# Patient Record
Sex: Male | Born: 1995 | Race: Black or African American | Hispanic: No | Marital: Single | Smoking: Current every day smoker
Health system: Southern US, Community
[De-identification: ages and names within clinical notes are randomized; demographics above are authoritative.]

## PROBLEM LIST (undated history)

## (undated) DIAGNOSIS — J45909 Unspecified asthma, uncomplicated: Secondary | ICD-10-CM

---
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
## 2016-07-19 ENCOUNTER — Encounter (HOSPITAL_COMMUNITY): Payer: Self-pay | Admitting: Emergency Medicine

## 2016-07-19 DIAGNOSIS — R5381 Other malaise: Secondary | ICD-10-CM | POA: Insufficient documentation

## 2016-07-19 DIAGNOSIS — F17200 Nicotine dependence, unspecified, uncomplicated: Secondary | ICD-10-CM | POA: Insufficient documentation

## 2016-07-19 DIAGNOSIS — J45909 Unspecified asthma, uncomplicated: Secondary | ICD-10-CM | POA: Insufficient documentation

## 2016-07-19 NOTE — ED Triage Notes (Signed)
Patient reports low grade fever this morning , denies cough or congestion / respirations unlabored , afebrile at triage .

## 2016-07-20 ENCOUNTER — Emergency Department (HOSPITAL_COMMUNITY)
Admission: EM | Admit: 2016-07-20 | Discharge: 2016-07-20 | Disposition: A | Discharge status: Home / Self Care | Payer: Self-pay | Attending: Emergency Medicine | Admitting: Emergency Medicine

## 2016-07-20 DIAGNOSIS — R5381 Other malaise: Secondary | ICD-10-CM

## 2016-07-20 HISTORY — DX: Unspecified asthma, uncomplicated: J45.909

## 2016-07-20 NOTE — ED Notes (Signed)
Pt verbalized understanding of d/c instructions and has no further questions. Pt stable and NAD. VSS. Pt given work note.

## 2016-07-20 NOTE — ED Provider Notes (Signed)
[facility]-EMERGENCY DEPT Provider Note   CSN: [id number] Arrival date & time: 07/19/16  2101     History   Chief Complaint Chief Complaint  Patient presents with  . Fever    HPI Dan Johnson is a 21 y.o. male.  Patient with history of asthma presents with one day history of low grade fever, fatigue, general malaise. When he woke this morning he felt symptomatic and reports through the day his symptoms have resolved. No cough, vomiting, diarrhea, pain. He states he came in because he needs a note to return to work tomorrow.    The history is provided by the patient. No language interpreter was used.    Past Medical History:  Diagnosis Date  . Asthma     There are no active problems to display for this patient.   History reviewed. No pertinent surgical history.     Home Medications    Prior to Admission medications   Medication Sig Start Date End Date Taking? Authorizing Provider  clindamycin (CLEOCIN) 150 MG capsule Take 2 capsules (300 mg total) by mouth 3 (three) times daily. 10/19/14   Niel Hummer, MD    Family History No family history on file.  Social History Social History  Substance Use Topics  . Smoking status: Current Every Day Smoker  . Smokeless tobacco: Never Used  . Alcohol use Yes     Allergies   Patient has no known allergies.   Review of Systems Review of Systems  Constitutional: Positive for fatigue and fever.  HENT: Negative.   Respiratory: Negative.   Cardiovascular: Negative.   Gastrointestinal: Negative.   Musculoskeletal: Positive for myalgias.  Skin: Negative.   Neurological: Negative.      Physical Exam Updated Vital Signs BP 120/80 (BP Location: Right Arm)   Pulse 72   Temp 98 F (36.7 C) (Oral)   Resp 16   Ht  (1.753 m)   Wt 60.3 kg   SpO2 99%   BMI 19.64 kg/m   Physical Exam  Constitutional: He is oriented to person, place, and time. He appears well-developed and well-nourished.  HENT:  Head:  Normocephalic.  Neck: Normal range of motion. Neck supple.  Cardiovascular: Normal rate and regular rhythm.   Pulmonary/Chest: Effort normal and breath sounds normal.  Abdominal: Soft. Bowel sounds are normal. There is no tenderness.  Musculoskeletal: Normal range of motion.  Neurological: He is alert and oriented to person, place, and time.  Skin: Skin is warm and dry.  Psychiatric: He has a normal mood and affect.     ED Treatments / Results  Labs (all labs ordered are listed, but only abnormal results are displayed) Labs Reviewed - No data to display  EKG  EKG Interpretation None       Radiology No results found.  Procedures Procedures (including critical care time)  Medications Ordered in ED Medications - No data to display   Initial Impression / Assessment and Plan / ED Course  I have reviewed the triage vital signs and the nursing notes.  Pertinent labs & imaging results that were available during my care of the patient were reviewed by me and considered in my medical decision making (see chart for details).     Patient with symptoms of malaise and "achiness' this morning, resolving through the day. He states his visit here is to have a note to return to work tomorrow.   Final Clinical Impressions(s) / ED Diagnoses   Final diagnoses:  Malaise  New Prescriptions Discharge Medication List as of 07/20/2016  1:13 AM       Elpidio Anis, PA-C 07/20/16 0145    Glynn Octave, MD 07/20/16 [phone]

## 2016-09-27 ENCOUNTER — Encounter (HOSPITAL_COMMUNITY): Payer: Self-pay | Admitting: Emergency Medicine

## 2016-09-27 ENCOUNTER — Emergency Department (HOSPITAL_COMMUNITY)
Admission: EM | Admit: 2016-09-27 | Discharge: 2016-09-27 | Disposition: A | Discharge status: Home / Self Care | Payer: Self-pay | Attending: Emergency Medicine | Admitting: Emergency Medicine

## 2016-09-27 DIAGNOSIS — F419 Anxiety disorder, unspecified: Secondary | ICD-10-CM | POA: Insufficient documentation

## 2016-09-27 DIAGNOSIS — F17200 Nicotine dependence, unspecified, uncomplicated: Secondary | ICD-10-CM | POA: Insufficient documentation

## 2016-09-27 DIAGNOSIS — F410 Panic disorder [episodic paroxysmal anxiety] without agoraphobia: Secondary | ICD-10-CM

## 2016-09-27 DIAGNOSIS — J45909 Unspecified asthma, uncomplicated: Secondary | ICD-10-CM | POA: Insufficient documentation

## 2016-09-27 MED ORDER — HYDROXYZINE HCL 25 MG PO TABS: 25.0000 mg | ORAL_TABLET | Freq: Four times a day (QID) | ORAL | 0 refills | Status: DC | PRN

## 2016-09-27 NOTE — Discharge Instructions (Signed)
Please read and follow all provided instructions.  Your diagnoses today include:  1. Anxiety attack     Tests performed today include: Vital signs. See below for your results today.   Medications prescribed:  Take as prescribed   Home care instructions:  Follow any educational materials contained in this packet.  Follow-up instructions: Please follow-up with your primary care provider for further evaluation of symptoms and treatment   Return instructions:  Please return to the Emergency Department if you do not get better, if you get worse, or new symptoms OR  - Fever (temperature greater than 101.57F)  - Bleeding that does not stop with holding pressure to the area    -Severe pain (please note that you may be more sore the day after your accident)  - Chest Pain  - Difficulty breathing  - Severe nausea or vomiting  - Inability to tolerate food and liquids  - Passing out  - Skin becoming red around your wounds  - Change in mental status (confusion or lethargy)  - New numbness or weakness    Please return if you have any other emergent concerns.  Additional Information:  Your vital signs today were: BP 132/76 (BP Location: Right Arm)    Pulse 82    Temp 98.3 F (36.8 C) (Oral)    Resp 16    SpO2 100%  If your blood pressure (BP) was elevated above 135/85 this visit, please have this repeated by your doctor within one month. ---------------

## 2016-09-27 NOTE — ED Triage Notes (Signed)
Per EMS, pt from home complains of anxious, hyperventilation since smoking weed and trying to drink water afterwards. Pt has hx of anxiety but is not on medication. Pt states he smoked from same batch of marijuana previously and didn't have issue. Pt denies loss of consciousness or other drug use. Pt A&Ox4. Pt does not have other medical hx besides anxiety.  Pt was able to slow breathing with EMS and reports improvement.   BP 126/70 HR 90 RR 16 SpO2 100 on RA CBG 107

## 2016-09-27 NOTE — ED Provider Notes (Signed)
[facility]-EMERGENCY DEPT Provider Note   CSN: [id number] Arrival date & time: 09/27/16  1115  By signing my name below, I, Rosario Adie, attest that this documentation has been prepared under the direction and in the presence of Audry Pili, PA-C.  Electronically Signed: Rosario Adie, ED Scribe. 09/27/16. 11:36 AM.  History   Chief Complaint Chief Complaint  Patient presents with  . Anxiety   The history is provided by the patient. No language interpreter was used.    HPI Comments: Dan Johnson is a 21 y.o. male BIB EMS, with a PMHx of asthma, who presents to the Emergency Department complaining of a resolved episode of anxiety which occurred prior to arrival. Per pt, he was smoking marijuana this morning when he had a sudden sensation of throat swelling. He attempted to remedy this by drinking water which relieved this sensation; however, shortly after he had an acute onset of anxiety with associated sensation of palpitations and chills. Pt has a h/o undiagnosed anxiety and reports that his symptoms today are consistent with this. He feels back towards his baseline while in the ED and notes that the episode lasted for approximately 20-30 minutes in total. Pt is not currently followed by a PCP. He denies chest pain, shortness of breath, nausea, vomiting, abdominal pain, difficulty swallowing, or any other associated symptoms.   Past Medical History:  Diagnosis Date  . Asthma    There are no active problems to display for this patient.  No past surgical history on file.  Home Medications    Prior to Admission medications   Medication Sig Start Date End Date Taking? Authorizing Provider  clindamycin (CLEOCIN) 150 MG capsule Take 2 capsules (300 mg total) by mouth 3 (three) times daily. 10/19/14   Niel Hummer, MD   Family History No family history on file.  Social History Social History  Substance Use Topics  . Smoking status: Current Every Day Smoker  . Smokeless  tobacco: Never Used  . Alcohol use Yes   Allergies   Patient has no known allergies.  Review of Systems Review of Systems  Constitutional: Positive for chills (resolved).  HENT: Negative for trouble swallowing.   Respiratory: Negative for shortness of breath.   Cardiovascular: Positive for palpitations (resolved). Negative for chest pain.  Gastrointestinal: Negative for abdominal pain, nausea and vomiting.  Psychiatric/Behavioral: The patient is nervous/anxious (resolved).    Physical Exam Updated Vital Signs BP 132/76 (BP Location: Right Arm)   Pulse 82   Temp 98.3 F (36.8 C) (Oral)   Resp 16   SpO2 100%   Physical Exam  Constitutional: He is oriented to person, place, and time. He appears well-developed and well-nourished. No distress.  Non-anxious appearing, distracted by TV.   HENT:  Head: Normocephalic and atraumatic.  Right Ear: Tympanic membrane, external ear and ear canal normal.  Left Ear: Tympanic membrane, external ear and ear canal normal.  Nose: Nose normal.  Mouth/Throat: Uvula is midline, oropharynx is clear and moist and mucous membranes are normal. No trismus in the jaw. No oropharyngeal exudate, posterior oropharyngeal erythema or tonsillar abscesses.  Eyes: Conjunctivae and EOM are normal. Pupils are equal, round, and reactive to light.  Neck: Normal range of motion. Neck supple. No tracheal deviation present.  Cardiovascular: Normal rate, regular rhythm, S1 normal, S2 normal, normal heart sounds, intact distal pulses and normal pulses.   No murmur heard. Pulmonary/Chest: Effort normal and breath sounds normal. No respiratory distress. He has no decreased breath sounds. He  has no wheezes. He has no rhonchi. He has no rales.  Abdominal: Normal appearance and bowel sounds are normal. He exhibits no distension. There is no tenderness.  Musculoskeletal: Normal range of motion.  Neurological: He is alert and oriented to person, place, and time.  Skin: Skin is  warm and dry. No pallor.  Psychiatric: He has a normal mood and affect. His speech is normal and behavior is normal. Thought content normal.  Nursing note and vitals reviewed.  ED Treatments / Results  DIAGNOSTIC STUDIES: Oxygen Saturation is 100% on RA, normal by my interpretation.   COORDINATION OF CARE: 11:36 AM-Discussed next steps with pt. Pt verbalized understanding and is agreeable with the plan.   Labs (all labs ordered are listed, but only abnormal results are displayed) Labs Reviewed - No data to display  EKG  EKG Interpretation None      Radiology No results found.  Procedures Procedures   Medications Ordered in ED Medications - No data to display  Initial Impression / Assessment and Plan / ED Course  I have reviewed the triage vital signs and the nursing notes.  Pertinent labs & imaging results that were available during my care of the patient were reviewed by me and considered in my medical decision making (see chart for details).   I obtained HPI from historian.  ED Course: Vistaril   Assessment: This is a [place]21yo male with a h/o asthma and undiagnosed anxiety. He presents with a resolved episode of anxiety which occurred prior to arrival approximately one hour following smoking marijuana. He is back towards baseline in the ED without acute complaints of CP, SOB, abd pain, or other emergent symptoms. No acute pathology identified in today's ED visit. Likely panic attack. Rx Vistaril and resources for for PCP follow up. Strict return precautions given.   Disposition/Plan:  D/c home Additional Verbal discharge instructions given and discussed with patient.  Pt Instructed to establish care w/ a PCP in the next week for evaluation and treatment of symptoms. Return precautions given Pt acknowledges and agrees with plan  Supervising Physician Long, Arlyss RepressJoshua G, MD  Final Clinical Impressions(s) / ED Diagnoses   Final diagnoses:  Anxiety attack   New  Prescriptions New Prescriptions   No medications on file    I personally performed the services described in this documentation, which was scribed in my presence. The recorded information has been reviewed and is accurate.     Audry PiliMohr, Ashlynd Michna, PA-C 09/27/16 1147    Long, Arlyss RepressJoshua G, MD 09/27/16 [phone]

## 2016-09-27 NOTE — ED Notes (Signed)
Bed: WTR9 Expected date:  Expected time:  Means of arrival:  Comments: EMS- 21yo M, anxiety after smoking marijuana

## 2022-08-15 ENCOUNTER — Encounter (HOSPITAL_BASED_OUTPATIENT_CLINIC_OR_DEPARTMENT_OTHER): Payer: Self-pay

## 2022-08-15 ENCOUNTER — Other Ambulatory Visit: Payer: Self-pay

## 2022-08-15 DIAGNOSIS — J029 Acute pharyngitis, unspecified: Secondary | ICD-10-CM | POA: Insufficient documentation

## 2022-08-15 LAB — GROUP A STREP BY PCR: Group A Strep by PCR: NOT DETECTED

## 2022-08-15 NOTE — ED Triage Notes (Signed)
Pt c/o sore throat x4-5 days, states "I thought it was a sore throat but now it's to the point that it hurts way worse." Pt reports "patchy, like a hole in my tonsil." Denies additional symptoms

## 2022-08-16 ENCOUNTER — Emergency Department (HOSPITAL_BASED_OUTPATIENT_CLINIC_OR_DEPARTMENT_OTHER)
Admission: EM | Admit: 2022-08-16 | Discharge: 2022-08-16 | Disposition: A | Discharge status: Home / Self Care | Payer: Self-pay | Attending: Emergency Medicine | Admitting: Emergency Medicine

## 2022-08-16 DIAGNOSIS — J029 Acute pharyngitis, unspecified: Secondary | ICD-10-CM

## 2022-08-16 MED ORDER — DEXAMETHASONE SODIUM PHOSPHATE 10 MG/ML IJ SOLN
10.0000 mg | Freq: Once | INTRAMUSCULAR | Status: AC
  Administered 2022-08-16: 10 mg via INTRAMUSCULAR
  Filled 2022-08-16: qty 1

## 2022-08-16 NOTE — ED Notes (Signed)
EDP at bedside  

## 2022-08-16 NOTE — ED Notes (Signed)
Discharge instructions provided by edp were reinforced to pt. Pt verbalized understanding with no additional questions at this time.  

## 2022-08-16 NOTE — ED Notes (Signed)
Pt brought back to room 10 from lobby. Pt continues to c/o throat pain and concerned for "cut to his tonsils"

## 2022-08-16 NOTE — ED Provider Notes (Signed)
   [place] EMERGENCY DEPARTMENT AT [facility]  Provider Note  CSN: [id number] Arrival date & time: 08/15/22 2023  History Chief Complaint  Patient presents with   Sore Throat    Dan Johnson is a 27 y.o. male reports 4-5 days of sore throat, worsening in last 2 days with a 'hole' in his tonsil. No fever. No cough.    Home Medications Prior to Admission medications   Medication Sig Start Date End Date Taking? Authorizing Provider  clindamycin (CLEOCIN) 150 MG capsule Take 2 capsules (300 mg total) by mouth 3 (three) times daily. 10/19/14   Niel Hummer, MD  hydrOXYzine (ATARAX/VISTARIL) 25 MG tablet Take 1 tablet (25 mg total) by mouth every 6 (six) hours as needed. 09/27/16   Audry Pili, PA-C     Allergies    Patient has no known allergies.   Review of Systems   Review of Systems Please see HPI for pertinent positives and negatives  Physical Exam BP (!) 133/97   Pulse 65   Temp 98.2 F (36.8 C) (Oral)   Resp 15   SpO2 100%   Physical Exam Vitals and nursing note reviewed.  HENT:     Head: Normocephalic.     Nose: Nose normal.     Mouth/Throat:     Mouth: Mucous membranes are moist. No oral lesions.     Pharynx: Uvula midline. No oropharyngeal exudate.     Comments: One large right tonsillar crypt with debris, otherwise mild erythema without exudate or signs of abcsess Eyes:     Extraocular Movements: Extraocular movements intact.  Pulmonary:     Effort: Pulmonary effort is normal.  Musculoskeletal:        General: Normal range of motion.     Cervical back: Neck supple.  Skin:    Findings: No rash (on exposed skin).  Neurological:     Mental Status: He is alert and oriented to person, place, and time.  Psychiatric:        Mood and Affect: Mood normal.     ED Results / Procedures / Treatments   EKG None  Procedures Procedures  Medications Ordered in the ED Medications  dexamethasone (DECADRON) injection 10 mg (10 mg Intramuscular Given  08/16/22 0248)    Initial Impression and Plan  Patient here with pharyngitis, no signs of abscess. Strep is neg. Plan decadron for swelling, recommend OTC symptomatic care. PCP follow up, RTED for any other concerns.    ED Course       MDM Rules/Calculators/A&P Medical Decision Making Problems Addressed: Pharyngitis, unspecified etiology: acute illness or injury  Amount and/or Complexity of Data Reviewed Labs: ordered. Decision-making details documented in ED Course.  Risk OTC drugs. Prescription drug management.     Final Clinical Impression(s) / ED Diagnoses Final diagnoses:  Pharyngitis, unspecified etiology    Rx / DC Orders ED Discharge Orders     None        Pollyann Savoy, MD 08/16/22 [phone]

## 2023-12-29 ENCOUNTER — Observation Stay (HOSPITAL_COMMUNITY)
Admission: EM | Admit: 2023-12-29 | Discharge: 2023-12-31 | Disposition: A | Discharge status: Home / Self Care | Attending: Neurology | Admitting: Neurology

## 2023-12-29 ENCOUNTER — Encounter (HOSPITAL_COMMUNITY): Payer: Self-pay

## 2023-12-29 ENCOUNTER — Other Ambulatory Visit: Payer: Self-pay

## 2023-12-29 ENCOUNTER — Emergency Department (HOSPITAL_COMMUNITY)

## 2023-12-29 DIAGNOSIS — K922 Gastrointestinal hemorrhage, unspecified: Principal | ICD-10-CM | POA: Diagnosis present

## 2023-12-29 DIAGNOSIS — J45909 Unspecified asthma, uncomplicated: Secondary | ICD-10-CM | POA: Insufficient documentation

## 2023-12-29 DIAGNOSIS — B9681 Helicobacter pylori [H. pylori] as the cause of diseases classified elsewhere: Secondary | ICD-10-CM | POA: Diagnosis not present

## 2023-12-29 DIAGNOSIS — R1084 Generalized abdominal pain: Secondary | ICD-10-CM | POA: Diagnosis present

## 2023-12-29 DIAGNOSIS — K2951 Unspecified chronic gastritis with bleeding: Principal | ICD-10-CM | POA: Insufficient documentation

## 2023-12-29 DIAGNOSIS — K209 Esophagitis, unspecified without bleeding: Secondary | ICD-10-CM | POA: Diagnosis not present

## 2023-12-29 DIAGNOSIS — K921 Melena: Secondary | ICD-10-CM | POA: Diagnosis not present

## 2023-12-29 DIAGNOSIS — F17210 Nicotine dependence, cigarettes, uncomplicated: Secondary | ICD-10-CM | POA: Diagnosis not present

## 2023-12-29 DIAGNOSIS — K264 Chronic or unspecified duodenal ulcer with hemorrhage: Secondary | ICD-10-CM | POA: Diagnosis not present

## 2023-12-29 DIAGNOSIS — Z7982 Long term (current) use of aspirin: Secondary | ICD-10-CM | POA: Insufficient documentation

## 2023-12-29 DIAGNOSIS — Z789 Other specified health status: Secondary | ICD-10-CM

## 2023-12-29 LAB — CBC
HCT: 37.1 % — ABNORMAL LOW (ref 39.0–52.0)
Hemoglobin: 12.8 g/dL — ABNORMAL LOW (ref 13.0–17.0)
MCH: 30.8 pg (ref 26.0–34.0)
MCHC: 34.5 g/dL (ref 30.0–36.0)
MCV: 89.2 fL (ref 80.0–100.0)
Platelets: 330 K/uL (ref 150–400)
RBC: 4.16 MIL/uL — ABNORMAL LOW (ref 4.22–5.81)
RDW: 12 % (ref 11.5–15.5)
WBC: 9.6 K/uL (ref 4.0–10.5)
nRBC: 0 % (ref 0.0–0.2)

## 2023-12-29 LAB — COMPREHENSIVE METABOLIC PANEL WITH GFR
ALT: 25 U/L (ref 0–44)
AST: 20 U/L (ref 15–41)
Albumin: 3.9 g/dL (ref 3.5–5.0)
Alkaline Phosphatase: 86 U/L (ref 38–126)
Anion gap: 11 (ref 5–15)
BUN: 32 mg/dL — ABNORMAL HIGH (ref 6–20)
CO2: 19 mmol/L — ABNORMAL LOW (ref 22–32)
Calcium: 9.1 mg/dL (ref 8.9–10.3)
Chloride: 107 mmol/L (ref 98–111)
Creatinine, Ser: 0.72 mg/dL (ref 0.61–1.24)
GFR, Estimated: >60 mL/min (ref >60)
Glucose, Bld: 108 mg/dL — ABNORMAL HIGH (ref 70–99)
Potassium: 4.4 mmol/L (ref 3.5–5.1)
Sodium: 137 mmol/L (ref 135–145)
Total Bilirubin: 0.8 mg/dL (ref 0.0–1.2)
Total Protein: 6.9 g/dL (ref 6.5–8.1)

## 2023-12-29 LAB — HEMOGLOBIN AND HEMATOCRIT, BLOOD
HCT: 33.5 % — ABNORMAL LOW (ref 39.0–52.0)
Hemoglobin: 11.5 g/dL — ABNORMAL LOW (ref 13.0–17.0)

## 2023-12-29 LAB — TYPE AND SCREEN
ABO/RH(D): O POS
Antibody Screen: NEGATIVE

## 2023-12-29 LAB — POC OCCULT BLOOD, ED: Fecal Occult Bld: POSITIVE — AB

## 2023-12-29 LAB — ABO/RH: ABO/RH(D): O POS

## 2023-12-29 MED ORDER — PANTOPRAZOLE SODIUM 40 MG PO TBEC: 40.0000 mg | DELAYED_RELEASE_TABLET | Freq: Every day | ORAL | 0 refills | Status: DC

## 2023-12-29 MED ORDER — PANTOPRAZOLE SODIUM 40 MG IV SOLR
40.0000 mg | Freq: Two times a day (BID) | INTRAVENOUS | Status: DC
Start: 2023-12-30 — End: 2023-12-31
  Administered 2023-12-30 – 2023-12-31 (×3): 40 mg via INTRAVENOUS
  Filled 2023-12-29 (×3): qty 10

## 2023-12-29 MED ORDER — SUCRALFATE 1 G PO TABS: 1.0000 g | ORAL_TABLET | Freq: Three times a day (TID) | ORAL | 0 refills | Status: DC

## 2023-12-29 MED ORDER — SODIUM CHLORIDE 0.9 % IV BOLUS
1000.0000 mL | Freq: Once | INTRAVENOUS | Status: AC
  Administered 2023-12-29: 1000 mL via INTRAVENOUS

## 2023-12-29 MED ORDER — PANTOPRAZOLE SODIUM 40 MG IV SOLR
40.0000 mg | Freq: Once | INTRAVENOUS | Status: AC
  Administered 2023-12-29: 40 mg via INTRAVENOUS
  Filled 2023-12-29: qty 10

## 2023-12-29 MED ORDER — ACETAMINOPHEN 650 MG RE SUPP: 650.0000 mg | Freq: Four times a day (QID) | RECTAL | Status: DC | PRN

## 2023-12-29 MED ORDER — ACETAMINOPHEN 325 MG PO TABS: 650.0000 mg | ORAL_TABLET | Freq: Four times a day (QID) | ORAL | Status: DC | PRN

## 2023-12-29 MED ORDER — ONDANSETRON HCL 4 MG/2ML IJ SOLN
4.0000 mg | Freq: Once | INTRAMUSCULAR | Status: AC
  Administered 2023-12-29: 4 mg via INTRAVENOUS
  Filled 2023-12-29: qty 2

## 2023-12-29 MED ORDER — IOHEXOL 350 MG/ML SOLN
75.0000 mL | Freq: Once | INTRAVENOUS | Status: AC | PRN
  Administered 2023-12-29: 75 mL via INTRAVENOUS

## 2023-12-29 NOTE — H&P (Addendum)
 Hospital Admission History and Physical Service Pager: [phone]  Patient name: Dan Johnson Medical record number: [id number] Date of Birth: 09-Dec-1995 Age: 28 y.o. Gender: male  Primary Care Provider: Patient, No Pcp Per  Consultants: Gastroenterology Code Status: FULL Preferred Emergency Contact: Sharene Prude (mother) - [phone] Contact Information     Name Relation Home Work Mobile   Love,Tiffany Aunt   [phone]      Other Contacts   None on File      Chief Complaint: GI bleeding  Differential and Medical Decision Making:  Alric Geise is a 28 y.o. male with PMHx anxiety and asthma presenting with bloody stools, abdominal pain, nausea and vomiting. Differential for this patient's presentation of this includes gastritis/ulcer (likely given presenting clinical symptoms, diet, family history), peptic ulcer disease (reflux symptoms, diet, recent NSAID use), bowel malignancy and hereditary bleeding disorder (no other CBC derangements or other sources of bleeding) Assessment & Plan GI bleed Unclear etiology given age and limited risk factors, may warrant colonoscopy if no source identified on EGD.  - Admit to FMTS, attending Dr. Delores - Vital signs per floor - Protonix  40 mg IV BID - GI consulted; plan is for endoscopy tomorrow morning - Regular diet, NPO at midnight 9/15 - VTE prophylaxis: SCDs - AM CBC Chronic health problem Asthma: Not currently on medication. Asymptomatic  FEN/GI: NPO at midnight on 9/14 VTE Prophylaxis: SCDs  Disposition: Med-Tele  History of Present Illness:  Dan Johnson is a 28 y.o. male presenting with a 2-day history of sharp and stabbing abdominal pain, nausea, vomiting, and melena in stool.   Reports the vomiting just started today, noted blood in it. Denies similar prior episode. Was taking Ibuprofen at one point (2-4 days max he took it) for headaches last week and maybe twice this week. Taking 2 Ibuprofen per day, unsure  which strength. Does not think anything is helping with his symptoms.  Reports having mixture of diarrhea and constipation. 9/12 started to have tarry and shiny stool. Did not poop after that time until today.   Had episode of chest pain 9/11, seen in ED. Loves to eat spicy foods. Mother has history of gastritis. GERD symptoms for 2-3 weeks. Thinks he has anxiety but has not had worse symptoms recently.  No work place exposure.   In the ED, patient found to have Hgb 11.5, pos fecal occult blood, unremarkable CTAP. GI was consulted and recommended endoscopy tomorrow. Patient given Protonix  40 mg IV.  Review Of Systems: Per HPI with the following additions: N/A  Pertinent Past Medical History: Asthma   Pertinent Past Surgical History: None   Pertinent Social History: Tobacco use: Yes, black and mild. Was vaping 4-5 days starting in the past week but usually does not vape.  Alcohol use: None Other Substance use: THC, has not smoked recently Lives with baby mama  Pertinent Family History: Mother: gastritis, DM Denies family hisotry of IBD or colon cancer  Important Outpatient Medications: Aspirin (took 1 tablet Fri and Sat)  Objective: BP 104/71 (BP Location: Left Arm)   Pulse 80   Temp 98.4 F (36.9 C) (Oral)   Resp 18   Ht 5' 9 (1.753 m)   Wt 63.5 kg   SpO2 100%   BMI 20.67 kg/m   Exam: General: lying in bed, no acute distress Eyes: EOM intact; normal sclera Cardiovascular: regular rate and rhythm; no murmurs Respiratory: clear to auscultation bilaterally; normal effort on RA Gastrointestinal: soft, non-distended; tender to palpation in  epigastric region MSK: ROM grossly intact Derm: warm and dry Neuro: alert and oriented Psych: pleasant, appropriate affect   Labs:  CBC BMET  Recent Labs  Lab 12/29/23 1341 12/29/23 1851  WBC 9.6  --   HGB 12.8* 11.5*  HCT 37.1* 33.5*  PLT 330  --    Recent Labs  Lab 12/29/23 1341  NA 137  K 4.4  CL 107  CO2 19*   BUN 32*  CREATININE 0.72  GLUCOSE 108*  CALCIUM 9.1     EKG: Normal sinus rhythm    Imaging Studies Performed:  CT Abdomen Pelvis 12/29/23 Impression from Radiologist:  1. A specific cause for the patient's abdominal pain is not identified.   Mannie Ashley SAILOR, MD 12/29/2023, 8:47 PM PGY-1, [facility]  FPTS Intern pager: [phone], text pages welcome Secure chat group [facility] Teaching Service   Upper Level Addendum:   I have seen and evaluated this patient along with Dr. Mannie and reviewed the above note, making necessary revisions as appropriate.  I agree with the medical decision making and physical exam as noted above.   Izetta Nap, DO PGY-3, [facility] Family [facility] Residency

## 2023-12-29 NOTE — Hospital Course (Addendum)
 Dan Johnson is a 28 y.o.male with a history of asthma who was admitted to the [facility] Medicine Teaching Service at [facility] for a gastrointestinal bleed found to have duodenal ulcer. His hospital course is detailed below:  GI Bleed Duodenal ulcer H. Pylori infection Patient presented with complaints of acute epigastric pain, melanotic stools, vomiting and nausea. He was started on Protonix  40 mg IV. Gastroenterology was consulted, and they recommended endoscopy for further evaluation. Patient underwent endoscopy on 12/30/23, with findings of duodenal ulcer (nonbleeding), which was treated; also found to have signs of esophagitis. Remained hemodynamically stable post-procedure. GI recommending 8 weeks of PO Protonix  BID following and avoidance of NSAIDs. Biopsy from endoscopy positive for H pylori and patient prescribed Pylera  at discharge.  Other chronic conditions were medically managed with home medications and formulary alternatives as necessary (asthma)  PCP Follow-up Recommendations: Ensure Protonix  40 mg BID x 8 weeks per GI Ensure compliance with Pylera  (10 day course total) Re-emphasize avoidance of NSAIDs Will need test of cure for H pylori with GI following treatment course

## 2023-12-29 NOTE — ED Triage Notes (Signed)
 Pt arrives via EMS from home. PT reports abdominal pain, nausea and vomiting. Reports blood in stool and vomit. Reports associated dizziness. Pt is AxOx4.

## 2023-12-29 NOTE — Assessment & Plan Note (Addendum)
 Asthma: Not currently on medication. Asymptomatic

## 2023-12-29 NOTE — ED Provider Notes (Signed)
 [facility]  SURGICAL Provider Note  CSN: [id number] Arrival date & time: 12/29/23 1332  Chief Complaint(s) GI Bleeding  HPI Jospeh Mangel is a 28 y.o. male with past medical history as below, significant for asthma who presents to the ED with complaint of generalized abdominal pain, blood in stool,  Patient reports abdominal pain over the past 2 days, nausea with intermittent vomiting.  Noticed dark-colored stool yesterday, no bowel movement today.  No blood thinners.  No history of prior GI bleed or GI surgery.  Pain to abdomen is generalized, seems to be moving around, sharp and stabbing pain.  Intermittent nausea, not currently nauseated.  No change in urination.  No excessive use of NSAIDs, no daily alcohol, no history of prior GI bleed, no recent iron supplements or Pepto-Bismol.  Note he was seen at the ER at [facility] on 9/11 with chest tightness after smoking marijuana, was discharged in stable condition  Past Medical History Past Medical History:  Diagnosis Date   Asthma    Patient Active Problem List   Diagnosis Date Noted   Duodenal ulcer with hemorrhage 12/30/2023   GI bleed 12/29/2023   Chronic health problem 12/29/2023   Home Medication(s) Prior to Admission medications   Medication Sig Start Date End Date Taking? Authorizing Provider  ASPIRIN PO Take 1 tablet by mouth once as needed (chest, abdominal pain). pink tablet   Yes [provider]  pantoprazole  (PROTONIX ) 40 MG tablet Take 1 tablet (40 mg total) by mouth daily for 14 days. 12/29/23 01/12/24 Yes Elnor Jayson LABOR, DO  sucralfate  (CARAFATE ) 1 g tablet Take 1 tablet (1 g total) by mouth with breakfast, with lunch, and with evening meal for 7 days. 12/29/23 01/05/24 Yes Elnor Jayson LABOR, DO                                                                                                                                    Past Surgical History History reviewed. No  pertinent surgical history. Family History History reviewed. No pertinent family history.  Social History Social History   Tobacco Use   Smoking status: Every Day    Types: Cigarettes, Cigars   Smokeless tobacco: Never   Tobacco comments:    Black and Mild.     4 days straight of Vaping.   Vaping Use   Vaping status: Some Days  Substance Use Topics   Alcohol use: Yes    Comment: ocassionally   Drug use: Yes    Types: Marijuana    Comment: Occasionally 2weeks ago   Allergies Patient has no known allergies.  Review of Systems A thorough review of systems was obtained and all systems are negative except as noted in the HPI and PMH.   Physical Exam Vital Signs  I have reviewed the triage vital signs BP 123/65 (BP Location: Right Arm)   Pulse 71  Temp 98 F (36.7 C)   Resp 18   Ht 5' 9 (1.753 m)   Wt 63.5 kg   SpO2 100%   BMI 20.67 kg/m  Physical Exam Vitals and nursing note reviewed. Exam conducted with a chaperone present (paramedic Roundtree).  Constitutional:      General: He is not in acute distress.    Appearance: Normal appearance. He is well-developed. He is not ill-appearing.  HENT:     Head: Normocephalic and atraumatic.     Right Ear: External ear normal.     Left Ear: External ear normal.     Nose: Nose normal.     Mouth/Throat:     Mouth: Mucous membranes are moist.  Eyes:     General: No scleral icterus.       Right eye: No discharge.        Left eye: No discharge.  Cardiovascular:     Rate and Rhythm: Normal rate.  Pulmonary:     Effort: Pulmonary effort is normal. No respiratory distress.     Breath sounds: No stridor.  Abdominal:     General: Abdomen is flat. There is no distension.     Palpations: Abdomen is soft.     Tenderness: There is generalized abdominal tenderness. There is no guarding.     Comments: Been soft, nonperitoneal  Genitourinary:    Comments: No stool in rectal vault, no frank bleeding, no evidence of external or  internal hemorrhoids. Musculoskeletal:        General: No deformity.     Cervical back: No rigidity.  Skin:    General: Skin is warm and dry.     Coloration: Skin is not cyanotic, jaundiced or pale.  Neurological:     Mental Status: He is alert.  Psychiatric:        Speech: Speech normal.        Behavior: Behavior normal. Behavior is cooperative.     ED Results and Treatments Labs (all labs ordered are listed, but only abnormal results are displayed) Labs Reviewed  COMPREHENSIVE METABOLIC PANEL WITH GFR - Abnormal; Notable for the following components:      Result Value   CO2 19 (*)    Glucose, Bld 108 (*)    BUN 32 (*)    All other components within normal limits  CBC - Abnormal; Notable for the following components:   RBC 4.16 (*)    Hemoglobin 12.8 (*)    HCT 37.1 (*)    All other components within normal limits  HEMOGLOBIN AND HEMATOCRIT, BLOOD - Abnormal; Notable for the following components:   Hemoglobin 11.5 (*)    HCT 33.5 (*)    All other components within normal limits  CBC - Abnormal; Notable for the following components:   RBC 3.67 (*)    Hemoglobin 11.3 (*)    HCT 32.4 (*)    All other components within normal limits  POC OCCULT BLOOD, ED - Abnormal; Notable for the following components:   Fecal Occult Bld POSITIVE (*)    All other components within normal limits  HIV ANTIBODY (ROUTINE TESTING W REFLEX)  FERRITIN  CBC  TYPE AND SCREEN  ABO/RH  SURGICAL PATHOLOGY  Radiology No results found.  Pertinent labs & imaging results that were available during my care of the patient were reviewed by me and considered in my medical decision making (see MDM for details).  Medications Ordered in ED Medications  acetaminophen  (TYLENOL ) tablet 650 mg ( Oral MAR Unhold 12/30/23 1232)    Or  acetaminophen  (TYLENOL ) suppository 650 mg ( Rectal  MAR Unhold 12/30/23 1232)  pantoprazole  (PROTONIX ) injection 40 mg ( Intravenous MAR Unhold 12/30/23 1232)  ferrous sulfate  tablet 325 mg (has no administration in time range)  pantoprazole  (PROTONIX ) injection 40 mg (40 mg Intravenous Given 12/29/23 1455)  ondansetron  (ZOFRAN ) injection 4 mg (4 mg Intravenous Given 12/29/23 1456)  sodium chloride  0.9 % bolus 1,000 mL (0 mLs Intravenous Stopped 12/29/23 1626)  iohexol  (OMNIPAQUE ) 350 MG/ML injection 75 mL (75 mLs Intravenous Contrast Given 12/29/23 1613)                                                                                                                                     Procedures Procedures  (including critical care time)  Medical Decision Making / ED Course    Medical Decision Making:    Clayten Allcock is a 28 y.o. male with past medical history as below, significant for asthma who presents to the ED with complaint of generalized abdominal pain, blood in stool,. The complaint involves an extensive differential diagnosis and also carries with it a high risk of complications and morbidity.  Serious etiology was considered. Ddx includes but is not limited to: Differential diagnosis includes but is not exclusive to acute cholecystitis, intrathoracic causes for epigastric abdominal pain, gastritis, duodenitis, pancreatitis, small bowel or large bowel obstruction, abdominal aortic aneurysm, hernia, gastritis, etc.   Complete initial physical exam performed, notably the patient was in no acute distress.   Of note patient's heart rate will increase when he is giving history then will normalize once I leave the room Reviewed and confirmed nursing documentation for past medical history, family history, social history.  Vital signs reviewed.    Generalized abdominal pain Possible blood in stool Nausea vomiting > - Generalized abdominal pain over the past 2 days, unable to identify alleviating or exacerbating factors.  Seems to be moving  around his abdomen.  Emesis earlier today, dark-colored stool bowel movement yesterday.  No thinners, no daily NSAIDs, no daily alcohol - Rectal exam without frank bleeding, no colonic stool in rectal vault.  No stool in rectal vault. - Patient had bowel movement is able send sample, but is Hemoccult positive - Patient is HDS; hgb on 9/11 was 15.3, today is 12.8, he is not anticoagulated but is having ongoing melanotic stool - will recheck hgb >> 11.5 - spoke w/ Dr JONELLE Bring w/ GI, recommend endoscopy tomorrow, request hospitalist admit, NPO @[place] , continue protonix  - favor UGIB, gastritis seems most likely at this point, no hematemesis  - pt/family agreeable to plan  Clinical  Course as of 12/30/23 1925  [place] Dec 29, 2023  1754 Hemoccult +tive [SG]    Clinical Course User Index [SG] Elnor Jayson LABOR, DO                    Additional history obtained: -Additional history obtained from family -External records from outside source obtained and reviewed including: Chart review including previous notes, labs, imaging, consultation notes including  Prior er eval Prior labs    Lab Tests: -I ordered, reviewed, and interpreted labs.   The pertinent results include:   Labs Reviewed  COMPREHENSIVE METABOLIC PANEL WITH GFR - Abnormal; Notable for the following components:      Result Value   CO2 19 (*)    Glucose, Bld 108 (*)    BUN 32 (*)    All other components within normal limits  CBC - Abnormal; Notable for the following components:   RBC 4.16 (*)    Hemoglobin 12.8 (*)    HCT 37.1 (*)    All other components within normal limits  HEMOGLOBIN AND HEMATOCRIT, BLOOD - Abnormal; Notable for the following components:   Hemoglobin 11.5 (*)    HCT 33.5 (*)    All other components within normal limits  CBC - Abnormal; Notable for the following components:   RBC 3.67 (*)    Hemoglobin 11.3 (*)    HCT 32.4 (*)    All other components within normal limits  POC OCCULT BLOOD, ED  - Abnormal; Notable for the following components:   Fecal Occult Bld POSITIVE (*)    All other components within normal limits  HIV ANTIBODY (ROUTINE TESTING W REFLEX)  FERRITIN  CBC  TYPE AND SCREEN  ABO/RH  SURGICAL PATHOLOGY    Notable for hgb low  EKG   EKG Interpretation Date/Time:  Sunday December 29 2023 14:58:17 EDT Ventricular Rate:  96 PR Interval:  104 QRS Duration:  79 QT Interval:  315 QTC Calculation: 398 R Axis:   83  Text Interpretation: Sinus rhythm Short PR interval Otherwise within normal limits No old tracing to compare Confirmed by Raford Lenis ([id number]) on 12/30/2023 2:29:20 AM         Imaging Studies ordered: I ordered imaging studies including CTAP I independently visualized the following imaging with scope of interpretation limited to determining acute life threatening conditions related to emergency care; findings noted above I agree with the radiologist interpretation If any imaging was obtained with contrast I closely monitored patient for any possible adverse reaction a/w contrast administration in the emergency department   Medicines ordered and prescription drug management: Meds ordered this encounter  Medications   pantoprazole  (PROTONIX ) injection 40 mg   ondansetron  (ZOFRAN ) injection 4 mg   sodium chloride  0.9 % bolus 1,000 mL   iohexol  (OMNIPAQUE ) 350 MG/ML injection 75 mL   pantoprazole  (PROTONIX ) 40 MG tablet    Sig: Take 1 tablet (40 mg total) by mouth daily for 14 days.    Dispense:  14 tablet    Refill:  0   sucralfate  (CARAFATE ) 1 g tablet    Sig: Take 1 tablet (1 g total) by mouth with breakfast, with lunch, and with evening meal for 7 days.    Dispense:  21 tablet    Refill:  0   OR Linked Order Group    acetaminophen  (TYLENOL ) tablet 650 mg    acetaminophen  (TYLENOL ) suppository 650 mg   pantoprazole  (PROTONIX ) injection 40 mg   DISCONTD: 0.9 %  sodium  chloride infusion   ferrous sulfate  tablet 325 mg   DISCONTD:  EPINEPHrine  1:10,000, 10 mL syringe/NS, 10 mL vial for sclerotherapy inj mixture    -I have reviewed the patients home medicines and have made adjustments as needed   Consultations Obtained: I requested consultation with the GI Dr Charlanne,  and discussed lab and imaging findings as well as pertinent plan - they recommend: scope tomorrow   Cardiac Monitoring: Continuous pulse oximetry interpreted by myself, 100% on RA.    Social Determinants of Health:  Diagnosis or treatment significantly limited by social determinants of health: thc use   Reevaluation: After the interventions noted above, I reevaluated the patient and found that they have improved  Co morbidities that complicate the patient evaluation  Past Medical History:  Diagnosis Date   Asthma       Dispostion: Disposition decision including need for hospitalization was considered, and patient admitted to the hospital.    Final Clinical Impression(s) / ED Diagnoses Final diagnoses:  Gastrointestinal hemorrhage, unspecified gastrointestinal hemorrhage type  Melena        Elnor Jayson LABOR, DO 12/30/23 1925

## 2023-12-29 NOTE — Assessment & Plan Note (Addendum)
 Unclear etiology given age and limited risk factors, may warrant colonoscopy if no source identified on EGD.  - Admit to FMTS, attending Dr. Delores - Vital signs per floor - Protonix  40 mg IV BID - GI consulted; plan is for endoscopy tomorrow morning - Regular diet, NPO at midnight 9/15 - VTE prophylaxis: SCDs - AM CBC

## 2023-12-30 ENCOUNTER — Observation Stay (HOSPITAL_COMMUNITY): Admitting: Anesthesiology

## 2023-12-30 ENCOUNTER — Encounter (HOSPITAL_COMMUNITY)
Admission: EM | Disposition: A | Discharge status: Home / Self Care | Payer: Self-pay | Source: Home / Self Care | Attending: Emergency Medicine

## 2023-12-30 ENCOUNTER — Observation Stay (HOSPITAL_BASED_OUTPATIENT_CLINIC_OR_DEPARTMENT_OTHER): Admitting: Anesthesiology

## 2023-12-30 ENCOUNTER — Encounter (HOSPITAL_COMMUNITY): Payer: Self-pay | Admitting: Family Medicine

## 2023-12-30 DIAGNOSIS — K209 Esophagitis, unspecified without bleeding: Secondary | ICD-10-CM | POA: Diagnosis not present

## 2023-12-30 DIAGNOSIS — D649 Anemia, unspecified: Secondary | ICD-10-CM

## 2023-12-30 DIAGNOSIS — K920 Hematemesis: Secondary | ICD-10-CM

## 2023-12-30 DIAGNOSIS — K264 Chronic or unspecified duodenal ulcer with hemorrhage: Secondary | ICD-10-CM

## 2023-12-30 DIAGNOSIS — K921 Melena: Secondary | ICD-10-CM

## 2023-12-30 DIAGNOSIS — K259 Gastric ulcer, unspecified as acute or chronic, without hemorrhage or perforation: Secondary | ICD-10-CM

## 2023-12-30 HISTORY — PX: ESOPHAGOGASTRODUODENOSCOPY: SHX5428

## 2023-12-30 LAB — HIV ANTIBODY (ROUTINE TESTING W REFLEX): HIV Screen 4th Generation wRfx: NONREACTIVE

## 2023-12-30 LAB — CBC
HCT: 32.4 % — ABNORMAL LOW (ref 39.0–52.0)
Hemoglobin: 11.3 g/dL — ABNORMAL LOW (ref 13.0–17.0)
MCH: 30.8 pg (ref 26.0–34.0)
MCHC: 34.9 g/dL (ref 30.0–36.0)
MCV: 88.3 fL (ref 80.0–100.0)
Platelets: 292 K/uL (ref 150–400)
RBC: 3.67 MIL/uL — ABNORMAL LOW (ref 4.22–5.81)
RDW: 12.2 % (ref 11.5–15.5)
WBC: 9.4 K/uL (ref 4.0–10.5)
nRBC: 0 % (ref 0.0–0.2)

## 2023-12-30 LAB — FERRITIN: Ferritin: 32 ng/mL (ref 24–336)

## 2023-12-30 SURGERY — EGD (ESOPHAGOGASTRODUODENOSCOPY)
Anesthesia: Monitor Anesthesia Care

## 2023-12-30 MED ORDER — PROPOFOL 500 MG/50ML IV EMUL
INTRAVENOUS | Status: DC | PRN
  Administered 2023-12-30: 50 mg via INTRAVENOUS
  Administered 2023-12-30: 250 ug/kg/min via INTRAVENOUS
  Administered 2023-12-30 (×2): 30 mg via INTRAVENOUS

## 2023-12-30 MED ORDER — SODIUM CHLORIDE (PF) 0.9 % IJ SOLN
PREFILLED_SYRINGE | INTRAVENOUS | Status: DC | PRN
  Administered 2023-12-30: 1.5 mL

## 2023-12-30 MED ORDER — SODIUM CHLORIDE 0.9 % IV SOLN: INTRAVENOUS | Status: DC | PRN

## 2023-12-30 MED ORDER — FERROUS SULFATE 325 (65 FE) MG PO TABS
325.0000 mg | ORAL_TABLET | Freq: Every day | ORAL | Status: DC
Start: 2023-12-31 — End: 2023-12-31
  Administered 2023-12-31: 325 mg via ORAL
  Filled 2023-12-30: qty 1

## 2023-12-30 MED ORDER — SODIUM CHLORIDE 0.9 % IV SOLN
INTRAVENOUS | Status: DC
Start: 2023-12-30 — End: 2023-12-30

## 2023-12-30 NOTE — Progress Notes (Signed)
 Pt mother- Ruddy Prude called for update on pt. Pt gave nurse permission to speak with her. Pt is still resting in bed, educate pt on diet. Pt has no pain at this time.

## 2023-12-30 NOTE — ED Notes (Signed)
 Pt to endoscopy.

## 2023-12-30 NOTE — Progress Notes (Signed)
 Pt tele box swap out to box 13 and central monitor notified.

## 2023-12-30 NOTE — Progress Notes (Signed)
     Daily Progress Note Intern Pager: [phone]  Patient name: Dan Johnson Medical record number: [id number] Date of birth: Nov 27, 1995 Age: 28 y.o. Gender: male  Primary Care Provider: Patient, No Pcp Per Consultants: GI Code Status: Full  Pt Overview and Major Events to Date:  9/14: admitted   Medical Decision Making:  Dan Johnson is a 28 y.o. male with a PMH of anxiety and asthma who presented with bloody stools, abdominal pain, N/V. Unclear etiology, plan for EGD today with GI.   Assessment & Plan GI bleed Unclear etiology given age and limited risk factors, may warrant colonoscopy if no source identified on EGD.  - Protonix  40 mg IV BID - GI consulted, appreciate recommendations - plan is for endoscopy today - Regular diet, NPO until endoscopy  - AM CBC Chronic health problem Asthma: Not currently on medication. Asymptomatic   FEN/GI: NPO pending endoscopy with GI PPx: SCDs Dispo: Pending clinical improvement.   Subjective:  Patient is seen lying in bed this morning. He states he is still experiencing some abdominal pain this morning. The pain comes and goes and he rates it as a 3/10. He denies any nausea this morning. He has not had any more episodes of vomiting overnight and no more stools.   Objective: Temp:  [98.2 F (36.8 C)-98.5 F (36.9 C)] 98.2 F (36.8 C) (09/15 0612) Pulse Rate:  [80-122] 86 (09/15 0500) Resp:  [11-24] 24 (09/15 0500) BP: (104-129)/(52-90) 111/52 (09/15 0500) SpO2:  [99 %-100 %] 100 % (09/15 0500) Weight:  [63.5 kg] 63.5 kg (09/14 1424) Physical Exam: General: NAD Cardiovascular: tachycardic, normal rhythm, no murmurs  Respiratory: CTAB, normal work of breathing on room air  Abdomen: soft, non-distended, tender to palpation in the epigastric and LUQ Extremities: warm, dry, no edema   Laboratory: Most recent CBC Lab Results  Component Value Date   WBC 9.4 12/30/2023   HGB 11.3 (L) 12/30/2023   HCT 32.4 (L) 12/30/2023    MCV 88.3 12/30/2023   PLT 292 12/30/2023   Most recent BMP    Latest Ref Rng & Units 12/29/2023    1:41 PM  BMP  Glucose 70 - 99 mg/dL 891   BUN 6 - 20 mg/dL 32   Creatinine 9.38 - 1.24 mg/dL 9.27   Sodium 864 - 854 mmol/L 137   Potassium 3.5 - 5.1 mmol/L 4.4   Chloride 98 - 111 mmol/L 107   CO2 22 - 32 mmol/L 19   Calcium 8.9 - 10.3 mg/dL 9.1    Imaging/Diagnostic Tests: No new imaging.   Lennie Raguel MATSU, DO 12/30/2023, 7:23 AM  PGY-1, [facility] FPTS Intern pager: [phone], text pages welcome Secure chat group [employer] [facility] Teaching Service

## 2023-12-30 NOTE — Anesthesia Preprocedure Evaluation (Signed)
 Anesthesia Evaluation  Patient identified by MRN, date of birth, ID band Patient awake  General Assessment Comment:  Presenting with suspected GI bleed. No vomiting since yesterday, feels a lot better than yesterday. NPO appropriate. Tolerated dinner last night.  Reviewed: Allergy & Precautions, NPO status , Patient's Chart, lab work & pertinent test results  History of Anesthesia Complications Negative for: history of anesthetic complications  Airway Mallampati: II  TM Distance: >3 FB Neck ROM: Full    Dental no notable dental hx. (+) Teeth Intact   Pulmonary neg sleep apnea, neg COPD, Current Smoker and Patient abstained from smoking.   Pulmonary exam normal breath sounds clear to auscultation       Cardiovascular Exercise Tolerance: Good METS(-) hypertension(-) CAD and (-) Past MI negative cardio ROS (-) dysrhythmias  Rhythm:Regular Rate:Normal - Systolic murmurs    Neuro/Psych negative neurological ROS  negative psych ROS   GI/Hepatic ,neg GERD  ,,(+)     substance abuse  marijuana use  Endo/Other  neg diabetes    Renal/GU negative Renal ROS     Musculoskeletal   Abdominal   Peds  Hematology   Anesthesia Other Findings Past Medical History: No date: Asthma  Reproductive/Obstetrics                              Anesthesia Physical Anesthesia Plan  ASA: 2  Anesthesia Plan: MAC   Post-op Pain Management: Minimal or no pain anticipated   Induction: Intravenous  PONV Risk Score and Plan: 0 and Propofol  infusion, TIVA and Ondansetron   Airway Management Planned: Nasal Cannula  Additional Equipment: None  Intra-op Plan:   Post-operative Plan:   Informed Consent: I have reviewed the patients History and Physical, chart, labs and discussed the procedure including the risks, benefits and alternatives for the proposed anesthesia with the patient or authorized representative who  has indicated his/her understanding and acceptance.     Dental advisory given  Plan Discussed with: CRNA and Surgeon  Anesthesia Plan Comments: (Discussed risks of anesthesia with patient, including possibility of difficulty with spontaneous ventilation under anesthesia necessitating airway intervention, PONV, and rare risks such as cardiac or respiratory or neurological events, and allergic reactions. Discussed the role of CRNA in patient's perioperative care. Patient understands.)        Anesthesia Quick Evaluation

## 2023-12-30 NOTE — Progress Notes (Signed)
 Pt arrived from ENDO in wheelchair this morning. No pain at this time. Pt stated he just want to sleep. Place tele monitor on pt and second verified by the NT. Pt is alert and orient, and oriented to the floor.

## 2023-12-30 NOTE — Consult Note (Addendum)
 Consultation Note   Referring Provider:   Family Medicine Teaching Service PCP: Patient, No Pcp Per Primary Gastroenterologist:    Sampson     Reason for Consultation: GI bleed DOA: 12/29/2023         Hospital Day: 2   ASSESSMENT    28 yo male with vague upper abdominal discomfort  /nausea episode of emesis, dark heme positive stool and elevated BUN. Suspect upper GI bleed secondary to erosive disease   Pathfork anemia.  Baseline hgb 15.4 ( on 12/26/23 at [facility]). Down to 12.8 yesterday with further decline to 11.3 in settting of GI bleed and (IV hydration)     Principal Problem:   GI bleed Active Problems:   Chronic health problem      PLAN:   --Schedule for EGD. The risks and benefits of EGD with possible biopsies were discussed with the patient who agrees to proceed.  --Continue BID IV PPI --Trend H/H    HPI   Dan Johnson was seen at [facility] on 12/26/2023 with chest pain and anxiety.  He was discharged home from the ED.  His hemoglobin at that time was 15.4.  He returned to [facility] ED yesterday with upper abdominal discomfort and complaints of blood in stool.  He was tachycardic on arrival but otherwise hemodynamically stable.  Patient describes vague generalized abdominal discomfort, predominantly mid upper abdomen over the last couple weeks.  Discomfort associated with bloating.  Symptoms worse after eating.  He has had some associated nausea.  Yesterday had an episode of dark emesis as well as dark stool.  He has taken about 8 doses of ibuprofen over the last month and recently took a couple doses of aspirin.  No other NSAIDs.  He has no chronic GI problems.  No family history of gastrointestinal cancers as far as he knows.  Pertinent data. Hemoglobin 11.3 32 Creatinine 0.72 LFTs normal Ferritin 32 FOBT positive  CT scan abdomen pelvis with contrast without acute findings.  Dan Johnson does not drink  alcohol.  He does use a lot  of tobacco and typically uses THC on a regular basis though has not felt well enough lately to do so.    Labs and Imaging:  Recent Labs    12/29/23 1341  PROT 6.9  ALBUMIN 3.9  AST 20  ALT 25  ALKPHOS 86  BILITOT 0.8   Recent Labs    12/29/23 1341 12/29/23 1851 12/30/23 0447  WBC 9.6  --  9.4  HGB 12.8* 11.5* 11.3*  HCT 37.1* 33.5* 32.4*  MCV 89.2  --  88.3  PLT 330  --  292   Recent Labs    12/29/23 1341  NA 137  K 4.4  CL 107  CO2 19*  GLUCOSE 108*  BUN 32*  CREATININE 0.72  CALCIUM 9.1     CT ABDOMEN PELVIS W CONTRAST CLINICAL DATA:  Acute abdominal pain. Rectal bleeding, nausea and vomiting.  EXAM: CT ABDOMEN AND PELVIS WITH CONTRAST  TECHNIQUE: Multidetector CT imaging of the abdomen and pelvis was performed using the standard protocol following bolus administration of intravenous contrast.  RADIATION DOSE REDUCTION: This exam was performed according to the departmental dose-optimization program which includes automated exposure control, adjustment of  the mA and/or kV according to patient size and/or use of iterative reconstruction technique.  CONTRAST:  75mL OMNIPAQUE  IOHEXOL  350 MG/ML SOLN  COMPARISON:  None Available.  FINDINGS: Lower chest: Unremarkable  Hepatobiliary: Unremarkable  Pancreas: Unremarkable  Spleen: Unremarkable  Adrenals/Urinary Tract: Unremarkable  Stomach/Bowel: Unremarkable.  Normal appendix.  Vascular/Lymphatic: Unremarkable  Reproductive: Unremarkable  Other: No supplemental non-categorized findings.  Musculoskeletal: Unremarkable  IMPRESSION: 1. A specific cause for the patient's abdominal pain is not identified.  Electronically Signed   By: Ryan Salvage M.D.   On: 12/29/2023 17:03       Past Medical History:  Diagnosis Date   Asthma     History reviewed. No pertinent surgical history.  History reviewed. No pertinent family history.  Prior to  Admission medications   Medication Sig Start Date End Date Taking? Authorizing Provider  ASPIRIN PO Take 1 tablet by mouth once as needed (chest, abdominal pain). pink tablet   Yes [provider]  pantoprazole  (PROTONIX ) 40 MG tablet Take 1 tablet (40 mg total) by mouth daily for 14 days. 12/29/23 01/12/24 Yes Elnor Savant A, DO  sucralfate  (CARAFATE ) 1 g tablet Take 1 tablet (1 g total) by mouth with breakfast, with lunch, and with evening meal for 7 days. 12/29/23 01/05/24 Yes Elnor Savant LABOR, DO    Current Facility-Administered Medications  Medication Dose Route Frequency Provider Last Rate Last Admin   acetaminophen  (TYLENOL ) tablet 650 mg  650 mg Oral Q6H PRN Stevens, Briana N, MD       Or   acetaminophen  (TYLENOL ) suppository 650 mg  650 mg Rectal Q6H PRN Mannie Ashley SAILOR, MD       pantoprazole  (PROTONIX ) injection 40 mg  40 mg Intravenous Q12H Stevens, Briana N, MD   40 mg at 12/30/23 9364   Current Outpatient Medications  Medication Sig Dispense Refill   ASPIRIN PO Take 1 tablet by mouth once as needed (chest, abdominal pain). pink tablet     pantoprazole  (PROTONIX ) 40 MG tablet Take 1 tablet (40 mg total) by mouth daily for 14 days. 14 tablet 0   sucralfate  (CARAFATE ) 1 g tablet Take 1 tablet (1 g total) by mouth with breakfast, with lunch, and with evening meal for 7 days. 21 tablet 0    Allergies as of 12/29/2023   (No Known Allergies)    Social History   Socioeconomic History   Marital status: Married    Spouse name: Not on file   Number of children: Not on file   Years of education: Not on file   Highest education level: Not on file  Occupational History   Not on file  Tobacco Use   Smoking status: Every Day   Smokeless tobacco: Never  Substance and Sexual Activity   Alcohol use: Yes   Drug use: No   Sexual activity: Not on file  Other Topics Concern   Not on file  Social History Narrative   Not on file   Social Drivers of Health   Financial  Resource Strain: Not on file  Food Insecurity: Not on file  Transportation Needs: Not on file  Physical Activity: Not on file  Stress: Not on file  Social Connections: Not on file  Intimate Partner Violence: Not on file     Code Status   Code Status: Full Code  Review of Systems: All systems reviewed and negative except where noted in HPI.  Physical Exam: Vital signs in last 24 hours: Temp:  [98.2 F (36.8 C)-98.5  F (36.9 C)] 98.2 F (36.8 C) (09/15 0612) Pulse Rate:  [79-122] 79 (09/15 0830) Resp:  [11-24] 14 (09/15 0830) BP: (104-129)/(52-90) 107/70 (09/15 0830) SpO2:  [99 %-100 %] 100 % (09/15 0830) Weight:  [63.5 kg] 63.5 kg (09/14 1424)    General:  Pleasant thin male in NAD Psych:  Cooperative. Normal mood and affect Eyes: Pupils equal Ears:  Normal auditory acuity Nose: No deformity, discharge or lesions Neck:  Supple, no masses felt Lungs:  Clear to auscultation.  Heart:  Regular rate, regular rhythm.  Abdomen:  Soft, nondistended, nontender, active bowel sounds, no masses felt Rectal :  Deferred Msk: Symmetrical without gross deformities.  Neurologic:  Alert, oriented, grossly normal neurologically Extremities : No edema Skin:  Intact without significant lesions.    Intake/Output from previous day: 09/14 0701 - 09/15 0700 In: 1000.4 [IV Piggyback:1000.4] Out: -  Intake/Output this shift:  No intake/output data recorded.   Dan Dasen, NP-C   12/30/2023, 9:06 AM  ----------------------------------------  I have taken a history, reviewed the chart and examined the patient. I performed a substantive portion of this encounter, including complete performance of at least one of the key components, in conjunction with the APP. I agree with the APP's note, impression and recommendations.  28 year old male with several days of upper abdominal/chest discomfort, with several days of dark stools, coffee-ground emesis, found to have hemoglobin drop of 3  points from baseline and elevated BUN. Constellation of symptoms and lab findings highly suggestive of upper GI bleed.  Suspect peptic ulcer disease given recent NSAID use.  Patient is hemodynamically stable.  Last episode of emesis and melenic stool yesterday.  Plan for EGD today.  If no high risk bleeding lesions, patient may be able to go home today following his procedure.    Aidenn Skellenger E. Stacia, MD [facility]astroenterology

## 2023-12-30 NOTE — Plan of Care (Signed)
   Problem: Activity: Goal: Risk for activity intolerance will decrease Outcome: Progressing   Problem: Nutrition: Goal: Adequate nutrition will be maintained Outcome: Progressing   Problem: Pain Managment: Goal: General experience of comfort will improve and/or be controlled Outcome: Progressing

## 2023-12-30 NOTE — Assessment & Plan Note (Signed)
 Unclear etiology given age and limited risk factors, may warrant colonoscopy if no source identified on EGD.  - Protonix  40 mg IV BID - GI consulted, appreciate recommendations - plan is for endoscopy today - Regular diet, NPO until endoscopy  - AM CBC

## 2023-12-30 NOTE — Assessment & Plan Note (Signed)
 Asthma: Not currently on medication. Asymptomatic

## 2023-12-30 NOTE — Transfer of Care (Signed)
 Immediate Anesthesia Transfer of Care Note  Patient: Dan Johnson  Procedure(s) Performed: EGD (ESOPHAGOGASTRODUODENOSCOPY)  Patient Location: PACU  Anesthesia Type:MAC  Level of Consciousness: sedated  Airway & Oxygen Therapy: Patient Spontanous Breathing  Post-op Assessment: Report given to RN  Post vital signs: Reviewed and stable  Last Vitals:  Vitals Value Taken Time  BP 136/79 12/30/23 11:53  Temp    Pulse 98 12/30/23 11:57  Resp 29 12/30/23 11:57  SpO2 99 % 12/30/23 11:57  Vitals shown include unfiled device data.  Last Pain:  Vitals:   12/30/23 1153  TempSrc:   PainSc: Asleep         Complications: No notable events documented.

## 2023-12-30 NOTE — Op Note (Signed)
 [facility] Patient Name: Dan Johnson Procedure Date : 12/30/2023 MRN: [id number] Attending MD: Glendia BRAVO. Johnson , MD, [id number] Date of Birth: April 25, 1995 CSN: [id number] Age: 28 Admit Type: Inpatient Procedure:                Upper GI endoscopy Indications:              Coffee-ground emesis, Melena Providers:                Glendia E. Stacia, MD, Hoy Penner, RN, Felice Sar, Technician Referring MD:              Medicines:                Monitored Anesthesia Care Complications:            No immediate complications. Estimated Blood Loss:     Estimated blood loss was minimal. Procedure:                Pre-Anesthesia Assessment:                           - Prior to the procedure, a History and Physical                            was performed, and patient medications and                            allergies were reviewed. The patient's tolerance of                            previous anesthesia was also reviewed. The risks                            and benefits of the procedure and the sedation                            options and risks were discussed with the patient.                            All questions were answered, and informed consent                            was obtained. Prior Anticoagulants: The patient has                            taken no anticoagulant or antiplatelet agents                            except for aspirin. ASA Grade Assessment: II - A                            patient with mild systemic disease. After reviewing  the risks and benefits, the patient was deemed in                            satisfactory condition to undergo the procedure.                           After obtaining informed consent, the endoscope was                            passed under direct vision. Throughout the                            procedure, the patient's blood pressure, pulse, and                             oxygen saturations were monitored continuously. The                            [id number]-[id number] ([id number]) Olympus endoscope was introduced                            through the mouth, and advanced to the second part                            of duodenum. The upper GI endoscopy was                            accomplished without difficulty. The patient                            tolerated the procedure well. Scope In: Scope Out: Findings:      The examined portions of the nasopharynx, oropharynx and larynx were       normal.      LA Grade B (one or more mucosal breaks greater than 5 mm, not extending       between the tops of two mucosal folds) esophagitis with no bleeding was       found at the gastroesophageal junction.      The exam of the esophagus was otherwise normal.      The entire examined stomach was normal. Biopsies were taken with a cold       forceps for Helicobacter pylori testing. Estimated blood loss was       minimal.      One non-bleeding superficial duodenal ulcer with adherent clot was found       in the duodenal bulb. The lesion was 7 mm in largest dimension. Area was       successfully injected with 2 mL of a 0.1 mg/mL solution of epinephrine        for hemostasis. Clot removal with snare was attempted. This was       successful and revealed an oozing lesion with pigmented material.       Coagulation for hemostasis using bipolar probe was successful. Estimated       blood loss: none.      The exam of the duodenum was otherwise normal. Impression:               -  The examined portions of the nasopharynx,                            oropharynx and larynx were normal.                           - LA Grade B esophagitis with no bleeding.                           - Normal stomach. Biopsied.                           - Non-bleeding duodenal ulcer with adherent clot.                            Injected. Treated with bipolar cautery. This is                             likely NSAID-induced Moderate Sedation:      N/A Recommendation:           - Return patient to hospital ward for ongoing care.                           - Full liquid diet today.                           - Continue present medications.                           - Await pathology results.                           - Continue IV Protonix  while admitted. Patient will                            need 8 weeks twice daily PPI to ensure healing of                            ulcer.                           - Avoid NSAIDs indefinitely. Use Tylenol                             preferentially for headaches/musculoskeletal pain.                           - If no evidence of bleeding overnight, patient can                            be discharged home tomorrow.                           - GI will sign off at this time. Procedure Code(s):        --- Professional ---  43255, 59, Esophagogastroduodenoscopy, flexible,                            transoral; with control of bleeding, any method                           43239, Esophagogastroduodenoscopy, flexible,                            transoral; with biopsy, single or multiple Diagnosis Code(s):        --- Professional ---                           K20.90, Esophagitis, unspecified without bleeding                           K26.4, Chronic or unspecified duodenal ulcer with                            hemorrhage                           K92.0, Hematemesis                           K92.1, Melena (includes Hematochezia) CPT copyright 2022 American Medical Association. All rights reserved. The codes documented in this report are preliminary and upon coder review may  be revised to meet current compliance requirements. Dan Daman E. Stacia, MD 12/30/2023 11:55:48 AM This report has been signed electronically. Number of Addenda: 0

## 2023-12-31 ENCOUNTER — Encounter (HOSPITAL_COMMUNITY): Payer: Self-pay | Admitting: Gastroenterology

## 2023-12-31 ENCOUNTER — Other Ambulatory Visit (HOSPITAL_COMMUNITY): Payer: Self-pay

## 2023-12-31 DIAGNOSIS — B9681 Helicobacter pylori [H. pylori] as the cause of diseases classified elsewhere: Secondary | ICD-10-CM | POA: Insufficient documentation

## 2023-12-31 DIAGNOSIS — K922 Gastrointestinal hemorrhage, unspecified: Secondary | ICD-10-CM | POA: Diagnosis not present

## 2023-12-31 LAB — CBC
HCT: 29.2 % — ABNORMAL LOW (ref 39.0–52.0)
HCT: 29.5 % — ABNORMAL LOW (ref 39.0–52.0)
Hemoglobin: 10.1 g/dL — ABNORMAL LOW (ref 13.0–17.0)
Hemoglobin: 10.4 g/dL — ABNORMAL LOW (ref 13.0–17.0)
MCH: 30.2 pg (ref 26.0–34.0)
MCH: 30.7 pg (ref 26.0–34.0)
MCHC: 34.6 g/dL (ref 30.0–36.0)
MCHC: 35.3 g/dL (ref 30.0–36.0)
MCV: 87.4 fL (ref 80.0–100.0)
MCV: 87 fL (ref 80.0–100.0)
Platelets: 281 K/uL (ref 150–400)
Platelets: 270 K/uL (ref 150–400)
RBC: 3.34 MIL/uL — ABNORMAL LOW (ref 4.22–5.81)
RBC: 3.39 MIL/uL — ABNORMAL LOW (ref 4.22–5.81)
RDW: 12.1 % (ref 11.5–15.5)
RDW: 12.1 % (ref 11.5–15.5)
WBC: 11.9 K/uL — ABNORMAL HIGH (ref 4.0–10.5)
WBC: 7.5 K/uL (ref 4.0–10.5)
nRBC: 0 % (ref 0.0–0.2)
nRBC: 0 % (ref 0.0–0.2)

## 2023-12-31 LAB — SURGICAL PATHOLOGY

## 2023-12-31 MED ORDER — FERROUS SULFATE 325 (65 FE) MG PO TABS
325.0000 mg | ORAL_TABLET | Freq: Every day | ORAL | 0 refills | Status: AC
  Filled 2023-12-31: qty 30, 30d supply, fill #0

## 2023-12-31 MED ORDER — BISMUTH/METRONIDAZ/TETRACYCLIN 140-125-125 MG PO CAPS
3.0000 | ORAL_CAPSULE | Freq: Four times a day (QID) | ORAL | 0 refills | Status: AC
  Filled 2023-12-31: qty 120, 10d supply, fill #0

## 2023-12-31 MED ORDER — SUCRALFATE 1 G PO TABS
1.0000 g | ORAL_TABLET | Freq: Three times a day (TID) | ORAL | 0 refills | Status: AC
Start: 2023-12-31 — End: 2024-01-30
  Filled 2023-12-31: qty 90, 30d supply, fill #0

## 2023-12-31 MED ORDER — PANTOPRAZOLE SODIUM 40 MG PO TBEC
40.0000 mg | DELAYED_RELEASE_TABLET | Freq: Every day | ORAL | 0 refills | Status: AC
  Filled 2023-12-31: qty 90, 90d supply, fill #0

## 2023-12-31 NOTE — Anesthesia Postprocedure Evaluation (Signed)
 Anesthesia Post Note  Patient: Dan Johnson  Procedure(s) Performed: EGD (ESOPHAGOGASTRODUODENOSCOPY)     Patient location during evaluation: Endoscopy Anesthesia Type: MAC Level of consciousness: awake and alert Pain management: pain level controlled Vital Signs Assessment: post-procedure vital signs reviewed and stable Respiratory status: spontaneous breathing, nonlabored ventilation, respiratory function stable and patient connected to nasal cannula oxygen Cardiovascular status: blood pressure returned to baseline and stable Postop Assessment: no apparent nausea or vomiting Anesthetic complications: no   No notable events documented.  Last Vitals:  Vitals:   12/30/23 2116 12/31/23 0557  BP:  118/77  Pulse: 86 71  Resp:  16  Temp:  36.8 C  SpO2:  100%    Last Pain:  Vitals:   12/31/23 0557  TempSrc: Oral  PainSc:    Pain Goal:                   [name] Ade

## 2023-12-31 NOTE — Progress Notes (Signed)
   12/31/23 1214  TOC Brief Assessment  Insurance and Status Reviewed  Patient has primary care physician No  Prior level of function: independent  Prior/Current Home Services No current home services  Social Drivers of Health Review SDOH reviewed no interventions necessary  Readmission risk has been reviewed Yes  Transition of care needs no transition of care needs at this time     Patient does not have a PCP . CMA scheduled appointment and placed information on AVS

## 2023-12-31 NOTE — Assessment & Plan Note (Deleted)
 Stable, no s/s of bleeding. Biopsy pending from endoscopy. - Progress diet from FLD to regular - IV Protonix  40 mg BID; continue IV while admitted per GI - GI consulted, appreciate recommendations - 8 weeks of BID PPI - Avoid NSAIDs indefinitely - Labs: PM CBC at 1400

## 2023-12-31 NOTE — Plan of Care (Signed)

## 2023-12-31 NOTE — Assessment & Plan Note (Addendum)
 Stable, no s/s of bleeding. Biopsy with H pylori - Progress diet from FLD to regular - IV Protonix  40 mg BID; continue IV while admitted per GI - GI consulted, appreciate recommendations - 8 weeks of BID PPI - Avoid NSAIDs indefinitely - Labs: PM CBC at 1400 - To start Pylera  at discharge, will need test of cure

## 2023-12-31 NOTE — Discharge Summary (Addendum)
 Family Medicine Teaching [facility] Discharge Summary  Patient name: Dan Johnson Medical record number: [id number] Date of birth: [birth year] Age: 28 y.o. Gender: male Date of Admission: 12/29/2023  Date of Discharge: 12/31/23 Admitting Physician: Suzann CHRISTELLA Daring, MD  Primary Care Provider: Patient, No Pcp Per Consultants: GI  Indication for Hospitalization: blood stools, abdominal pain, N/V  Discharge Diagnoses/Problem List:  Principal Problem for Admission: GI bleed 2/2 duodenal ulcer Other Problems addressed during stay:  Principal Problem:   Duodenal ulcer with hemorrhage Active Problems:   GI bleed   H Pylori infection  Brief Hospital Course:  Dan Johnson is a 28 y.o.male with a history of asthma who was admitted to the [facility] Medicine Teaching Service at [facility] for a gastrointestinal bleed found to have duodenal ulcer. His hospital course is detailed below:  GI Bleed Duodenal ulcer H. Pylori infection Patient presented with complaints of acute epigastric pain, melanotic stools, vomiting and nausea. He was started on Protonix  40 mg IV. Gastroenterology was consulted, and they recommended endoscopy for further evaluation. EGD on 12/30/23, with findings of duodenal ulcer (nonbleeding), which was treated; also found to have signs of esophagitis. Remained hemodynamically stable post-procedure. GI recommending 8 weeks of PO Protonix  BID and avoidance of NSAIDs. Biopsy from endoscopy positive for H pylori and patient prescribed Pylera  at discharge and instructed on how to successfully complete regimen.  Other chronic conditions were medically managed with home medications and formulary alternatives as necessary (asthma)  PCP Follow-up Recommendations: Ensure Protonix  40 mg BID x 8 weeks per GI (9/15-11/10) Ensure compliance with Pylera  (10 day course total) Re-emphasize avoidance of NSAIDs Will need test of cure for H pylori with GI following treatment course ( ~11/24, 2 wk  s/p PPI course)   Results/Tests Pending at Time of Discharge:  Unresulted Labs (From admission, onward)    None      Disposition: Home  Discharge Condition: Stable  Discharge Exam:  Vitals:   12/31/23 0557 12/31/23 0852  BP: 118/77 118/76  Pulse: 71 74  Resp: 16 18  Temp: 98.3 F (36.8 C) 98.1 F (36.7 C)  SpO2: 100% 100%   Physical Exam: General: Sitting up in bed, no acute distress. Cardiovascular: Regular rate and rhythm, no murmurs/rubs/gallops. Respiratory: Normal work of breathing on room air. Clear to auscultation bilaterally; no wheezes, crackles. Abdomen: Bowel sounds present and normoactive bilaterally. Soft, nondistended, nontender.  Significant Procedures:  Endoscopy 9/15:  - The examined portions of the nasopharynx, oropharynx and larynx were normal. - LA Grade B esophagitis with no bleeding. - Normal stomach. Biopsied. - Non-bleeding duodenal ulcer with adherent clot. Injected. Treated with bipolar cautery. This is likely NSAID-induced  Significant Labs and Imaging:  Recent Labs  Lab 12/30/23 0447 12/31/23 0242 12/31/23 1510  WBC 9.4 11.9* 7.5  HGB 11.3* 10.1* 10.4*  HCT 32.4* 29.2* 29.5*  PLT 292 281 270   No results for input(s): NA, K, CL, CO2, GLUCOSE, BUN, CREATININE, CALCIUM, MG, PHOS, ALKPHOS, AST, ALT, ALBUMIN, PROTEIN in the last 48 hours.  Surgical Path 9/15: H. Pylori identified; negative for intestinal metaplasia or dysplasia.   9/14 CAP: No acute abnormality   Discharge Medications:  Allergies as of 12/31/2023   No Known Allergies      Medication List     STOP taking these medications    ASPIRIN PO       TAKE these medications    FeroSul 325 (65 Fe) MG tablet Generic drug: ferrous sulfate  Take 1 tablet (325 mg  total) by mouth daily with breakfast.   pantoprazole  40 MG tablet Commonly known as: PROTONIX  Take 1 tablet (40 mg total) by mouth daily.   Pylera  140-125-125 MG  Caps Generic drug: Bismuth /Metronidaz/Tetracyclin Take 3 capsules by mouth 4 (four) times daily for 10 days.   sucralfate  1 g tablet Commonly known as: Carafate  Take 1 tablet (1 g total) by mouth with breakfast, with lunch, and with evening meal.        Discharge Instructions: Please refer to Patient Instructions section of EMR for full details.  Patient was counseled important signs and symptoms that should prompt return to medical care, changes in medications, dietary instructions, activity restrictions, and follow up appointments.   Follow-Up Appointments:  Follow-up Information     Shila Gustav GAILS, MD. Call .   Specialty: Gastroenterology Why: Follow up from ER visit Contact information: [address] [place] [address] [phone]         [name]. Schedule an appointment as soon as possible for a visit in 2 days.   Specialty: Internal Medicine Why: Follow up from ER visit, If you do not have PCP, please see PCP above Contact information: [address] [place]  [address] [phone]        [facility]. Schedule an appointment as soon as possible for a visit in 2 days.   Contact information: [address] [place]  [phone]        [facility]. Schedule an appointment as soon as possible for a visit in 2 days.   Why: If you do not have PCP, please see PCP above, Follow up from ER visit Contact information: [address] [employer] [address] [place]  [address] [phone]        [facility] Emergency Department at [facility]. Go to .   Specialty: Emergency Medicine Why: As needed, If symptoms worsen Contact information: [address] [place]  [address] [phone]        Elnor Lauraine BRAVO, NP Follow up.   Specialty: Nurse Practitioner Why: TIME : 11:00 AM    PLEASE ARRIVE AT 10:30 AM DATE : February 07, 2024 FRIDAY  PLEASE BRING ALL CURRENT MEDICATION, ID and INS CARD Contact information: [address] [place] [address] [phone]                 Larraine Palma, MD 12/31/2023, 3:49 PM PGY-1, [facility] Family Medicine   I have discussed the above with Dr. Larraine and agree with the documented plan. My edits for correction/addition/clarification are included above. Please see any attending notes.   Kathrine Melena, DO PGY-2, [place] [facility] 12/31/2023 4:31 PM

## 2023-12-31 NOTE — Discharge Instructions (Addendum)
 Dear Dan Johnson,  Thank you for letting us  participate in your care. You were hospitalized for a GI bleed and diagnosed with a duodenal ulcer, seen on endoscopy. A biopsy from this endoscopy is still resulting, and you will be contacted with the results once they are back. You were treated with IV Protonix  while in the hospital. You will need to continued to take a pill form of Protonix  to help the ulcer heal for 8 weeks total.  You were also found to have H pylori infection, which can also contribute to ulcer. We are treating this with a medication called Pylera , and you will follow-up with GI after finishing this medication to make sure the H pylori is gone.  MEDICATION CHANGES - Take Protonix  40 mg twice daily (once in the morning, once in the evening) for a total of 8 weeks - Take Pylera  3 capsules four times a day for 10 days total  POST-HOSPITAL & CARE INSTRUCTIONS DO NOT take any NSAIDs (such as aspirin, ibuprofen); you can use tylenol  for pain Please call to arrange follow up with your primary care physician in 1 week. Go to your follow up appointments (listed below)   DOCTOR'S APPOINTMENT   No future appointments.  Follow-up Information     Call  Shila Gustav GAILS, MD.   Specialty: Gastroenterology Why: Follow up from ER visit Contact information: [address] [place] [address] [phone]         [facility]. Schedule an appointment as soon as possible for a visit in 2 days.   Specialty: Internal Medicine Why: Follow up from ER visit, If you do not have PCP, please see PCP above Contact information: [address] [place]  [address] [phone]        [facility]. Schedule an appointment as soon as possible for a visit in 2 days.   Contact information: [address] [place]  [phone]        [facility].  Schedule an appointment as soon as possible for a visit in 2 days.   Why: If you do not have PCP, please see PCP above, Follow up from ER visit Contact information: [address] [place]  [address] [phone]        Go to  [facility] Emergency Department at [facility].   Specialty: Emergency Medicine Why: As needed, If symptoms worsen Contact information: [address] [place]  [address] [phone]                Take care and be well!  Family Medicine Teaching Service Inpatient Team [place]  [facility]  [address] [place], [place] [address] [phone]

## 2023-12-31 NOTE — Progress Notes (Addendum)
 Daily Progress Note Intern Pager: [phone]  Patient name: Dan Johnson Medical record number: [id number] Date of birth: July 18, 1995 Age: 28 y.o. Gender: male  Primary Care Provider: Patient, No Pcp Per Consultants: GI (signed off 9/15) Code Status: Full  Pt Overview and Major Events to Date:  - 9/14: Admitted - 9/15: Endoscopy  Medical Decision Making:  Sofia Jaquith is a 28 y.o. male with PMH of anxiety, asthma admitted with bloody stools, abdominal pain and N/V with endoscopy 9/15 with GI which showed nonbleeding duodenal ulcer, treated and suspected to be NSAID-induced. Also with evidence of esophagitis. Stable overnight with no signs or symptoms of continued bleeding today, Hgb 11.3>10.1 but drop likely related to prior procedure. Will reassess CBC this afternoon and if stable plan to discharge home. Biopsy positive for H pylori, will plan to treat with Pylera  (to begin at discharge rather than multidrug regimen to begin inpatient). Assessment & Plan Duodenal ulcer with hemorrhage GI bleed H pylori ulcer Stable, no s/s of bleeding. Biopsy with H pylori - Progress diet from FLD to regular - IV Protonix  40 mg BID; continue IV while admitted per GI - GI consulted, appreciate recommendations - 8 weeks of BID PPI - Avoid NSAIDs indefinitely - Labs: PM CBC at 1400 - To start Pylera  at discharge, will need test of cure  Chronic health problem Asthma: Not currently on medication. Asymptomatic  FEN/GI: Advance diet to Regular PPx: SCDs Dispo: Home today pending repeat pm CBC with stable Hgb  Subjective:  No overnight events.  Today, patient reports he is feeling well, is hungry but otherwise with no concerns. No abdominal, throat pain. Denies lightheadedness, dizziness, presyncope, N/V. Reports is passing gas.  Objective: Temp:  [97.6 F (36.4 C)-98.3 F (36.8 C)] 98.1 F (36.7 C) (09/16 0852) Pulse Rate:  [71-110] 74 (09/16 0852) Resp:  [11-27] 18 (09/16 0852) BP:  (118-141)/(65-91) 118/76 (09/16 0852) SpO2:  [90 %-100 %] 100 % (09/16 0852) Physical Exam: General: Sitting up in bed, no acute distress. Cardiovascular: Regular rate and rhythm, no murmurs/rubs/gallops. Respiratory: Normal work of breathing on room air. Clear to auscultation bilaterally; no wheezes, crackles. Abdomen: Bowel sounds present and normoactive bilaterally. Soft, nondistended, nontender.  Laboratory: Most recent CBC Lab Results  Component Value Date   WBC 11.9 (H) 12/31/2023   HGB 10.1 (L) 12/31/2023   HCT 29.2 (L) 12/31/2023   MCV 87.4 12/31/2023   PLT 281 12/31/2023   Most recent BMP    Latest Ref Rng & Units 12/29/2023    1:41 PM  BMP  Glucose 70 - 99 mg/dL 891   BUN 6 - 20 mg/dL 32   Creatinine 9.38 - 1.24 mg/dL 9.27   Sodium 864 - 854 mmol/L 137   Potassium 3.5 - 5.1 mmol/L 4.4   Chloride 98 - 111 mmol/L 107   CO2 22 - 32 mmol/L 19   Calcium 8.9 - 10.3 mg/dL 9.1    Surgical Path 0/84: H. Pylori identified; negative for intestinal metaplasia or dysplasia.  Imaging/Diagnostic Tests: Endoscopy 9/15: Impression:                - The examined portions of the nasopharynx, oropharynx and larynx were normal. - LA Grade B esophagitis with no bleeding. - Normal stomach. Biopsied. - Non-bleeding duodenal ulcer with adherent clot. Injected. Treated with bipolar cautery. This is likely NSAID-induced  Larraine Palma, MD 12/31/2023, 11:09 AM  PGY-1, [facility] Family Medicine FPTS Intern pager: [phone], text pages welcome Secure chat  group [facility] Teaching Service

## 2023-12-31 NOTE — Assessment & Plan Note (Addendum)
 Asthma: Not currently on medication. Asymptomatic

## 2024-01-04 ENCOUNTER — Ambulatory Visit: Payer: Self-pay | Admitting: Gastroenterology

## 2024-01-04 NOTE — Progress Notes (Signed)
 Dan Johnson,  The biopsies from your recent upper GI Endoscopy were notable for H. Pylori gastritis.  H. pylori is a common bacteria which can cause chronic symptoms of abdominal pain, nausea and bloating.  It can also cause iron deficiency anemia as well as increase the risk of stomach ulcers and stomach cancer.  We need to eradicate the bacteria.  Sometimes the bacteria is very difficult to eradicate, so it is  important to take the medications as directed. Please take the medications below:  1) Pantoprazole  40 mg 2 times a day x 14 d (you are already taking this) 2) Pepto Bismol 2 tabs (262 mg each) 4 times a day x 14 d 3) Metronidazole  250 mg 4 times a day x 14 d 4) doxycycline 100 mg 2 times a day x 14 d  2 weeks after you finish your course of pantoprazole , check H. Pylori stool antigen to confirm eradication.  Dx: H. Pylori gastritis  Team,  Please call Dan Johnson, relay the above message, and place orders for bismuth , antibiotics and H. Pylori stool antigen or PCR stool sample.  He should already have Protonix 

## 2024-02-07 ENCOUNTER — Ambulatory Visit: Admitting: Nurse Practitioner

## 2024-03-24 ENCOUNTER — Encounter: Payer: Self-pay | Admitting: Internal Medicine
